# Patient Record
Sex: Female | Born: 1985 | Hispanic: No | Marital: Married | Smoking: Never smoker
Health system: Southern US, Community
[De-identification: ages and names within clinical notes are randomized; demographics above are authoritative.]

---

## 2008-01-25 ENCOUNTER — Other Ambulatory Visit: Admission: RE | Admit: 2008-01-25 | Discharge: 2008-01-25 | Payer: Self-pay | Admitting: Obstetrics and Gynecology

## 2009-02-02 ENCOUNTER — Other Ambulatory Visit: Admission: RE | Admit: 2009-02-02 | Discharge: 2009-02-02 | Payer: Self-pay | Admitting: Obstetrics and Gynecology

## 2009-12-21 ENCOUNTER — Other Ambulatory Visit: Admission: RE | Admit: 2009-12-21 | Discharge: 2009-12-21 | Payer: Self-pay | Admitting: Obstetrics and Gynecology

## 2010-12-17 ENCOUNTER — Other Ambulatory Visit
Admission: RE | Admit: 2010-12-17 | Discharge: 2010-12-17 | Payer: Self-pay | Source: Home / Self Care | Admitting: Obstetrics and Gynecology

## 2010-12-29 ENCOUNTER — Encounter: Payer: Self-pay | Admitting: Obstetrics and Gynecology

## 2012-01-13 ENCOUNTER — Other Ambulatory Visit (HOSPITAL_COMMUNITY)
Admission: RE | Admit: 2012-01-13 | Discharge: 2012-01-13 | Disposition: A | Discharge status: Home / Self Care | Payer: BC Managed Care – PPO | Source: Ambulatory Visit | Attending: Obstetrics and Gynecology | Admitting: Obstetrics and Gynecology

## 2012-01-13 ENCOUNTER — Other Ambulatory Visit: Payer: Self-pay | Admitting: Obstetrics and Gynecology

## 2012-01-13 DIAGNOSIS — Z01419 Encounter for gynecological examination (general) (routine) without abnormal findings: Secondary | ICD-10-CM | POA: Insufficient documentation

## 2013-02-15 ENCOUNTER — Other Ambulatory Visit: Payer: Self-pay | Admitting: Obstetrics and Gynecology

## 2013-02-15 ENCOUNTER — Other Ambulatory Visit (HOSPITAL_COMMUNITY)
Admission: RE | Admit: 2013-02-15 | Discharge: 2013-02-15 | Disposition: A | Discharge status: Home / Self Care | Payer: BC Managed Care – PPO | Source: Ambulatory Visit | Attending: Obstetrics and Gynecology | Admitting: Obstetrics and Gynecology

## 2013-02-15 DIAGNOSIS — Z01419 Encounter for gynecological examination (general) (routine) without abnormal findings: Secondary | ICD-10-CM | POA: Insufficient documentation

## 2014-02-20 ENCOUNTER — Other Ambulatory Visit (HOSPITAL_COMMUNITY)
Admission: RE | Admit: 2014-02-20 | Discharge: 2014-02-20 | Disposition: A | Discharge status: Home / Self Care | Payer: BC Managed Care – PPO | Source: Ambulatory Visit | Attending: Obstetrics and Gynecology | Admitting: Obstetrics and Gynecology

## 2014-02-20 ENCOUNTER — Other Ambulatory Visit: Payer: Self-pay | Admitting: Obstetrics and Gynecology

## 2014-02-20 DIAGNOSIS — Z01419 Encounter for gynecological examination (general) (routine) without abnormal findings: Secondary | ICD-10-CM | POA: Insufficient documentation

## 2014-08-07 ENCOUNTER — Other Ambulatory Visit: Payer: Self-pay | Admitting: Orthopedic Surgery

## 2014-08-07 DIAGNOSIS — M25561 Pain in right knee: Secondary | ICD-10-CM

## 2014-08-16 ENCOUNTER — Ambulatory Visit
Admission: RE | Admit: 2014-08-16 | Discharge: 2014-08-16 | Disposition: A | Discharge status: Home / Self Care | Payer: BC Managed Care – PPO | Source: Ambulatory Visit | Attending: Orthopedic Surgery | Admitting: Orthopedic Surgery

## 2014-08-16 DIAGNOSIS — M25561 Pain in right knee: Secondary | ICD-10-CM

## 2015-03-06 ENCOUNTER — Other Ambulatory Visit (HOSPITAL_COMMUNITY)
Admission: RE | Admit: 2015-03-06 | Discharge: 2015-03-06 | Disposition: A | Discharge status: Home / Self Care | Payer: BLUE CROSS/BLUE SHIELD | Source: Ambulatory Visit | Attending: Obstetrics and Gynecology | Admitting: Obstetrics and Gynecology

## 2015-03-06 ENCOUNTER — Other Ambulatory Visit: Payer: Self-pay | Admitting: Obstetrics and Gynecology

## 2015-03-06 DIAGNOSIS — Z01419 Encounter for gynecological examination (general) (routine) without abnormal findings: Secondary | ICD-10-CM | POA: Insufficient documentation

## 2015-03-08 LAB — CYTOLOGY - PAP

## 2015-09-26 ENCOUNTER — Ambulatory Visit (INDEPENDENT_AMBULATORY_CARE_PROVIDER_SITE_OTHER): Payer: BLUE CROSS/BLUE SHIELD | Admitting: Podiatry

## 2015-09-26 VITALS — BP 108/73 | HR 75 | Resp 16

## 2015-09-26 DIAGNOSIS — L600 Ingrowing nail: Secondary | ICD-10-CM

## 2015-09-26 NOTE — Progress Notes (Signed)
Subjective:     Patient ID: Cynthia Pineda, female   DOB: 10/18/1986, 29 y.o.   MRN: [id number]  HPI patient states this nail continues to come off and split and I have had a remove once 5 years ago but it did not make a big difference   Review of Systems  All other systems reviewed and are negative.      Objective:   Physical Exam  Constitutional: She is oriented to person, place, and time.  Cardiovascular: Intact distal pulses.   Musculoskeletal: Normal range of motion.  Neurological: She is oriented to person, place, and time.  Skin: Skin is warm.  Nursing note and vitals reviewed.  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 I educated patient on this and currently will not hold off and continue the conservative treatments that she is doing

## 2015-09-26 NOTE — Progress Notes (Signed)
   Subjective:    Patient ID: Cynthia Pineda, female    DOB: 10/26/1986, 29 y.o.   MRN: [id number]  HPI  Pt presents with left great nail splitting, h/o previous avulsion to treat similar problem.  Review of Systems  All other systems reviewed and are negative.      Objective:   Physical Exam        Assessment & Plan:

## 2016-03-05 ENCOUNTER — Other Ambulatory Visit (HOSPITAL_COMMUNITY)
Admission: RE | Admit: 2016-03-05 | Discharge: 2016-03-05 | Disposition: A | Discharge status: Home / Self Care | Payer: BLUE CROSS/BLUE SHIELD | Source: Ambulatory Visit | Attending: Obstetrics and Gynecology | Admitting: Obstetrics and Gynecology

## 2016-03-05 ENCOUNTER — Other Ambulatory Visit: Payer: Self-pay | Admitting: Obstetrics and Gynecology

## 2016-03-05 DIAGNOSIS — Z01419 Encounter for gynecological examination (general) (routine) without abnormal findings: Secondary | ICD-10-CM | POA: Diagnosis present

## 2016-03-05 DIAGNOSIS — Z1151 Encounter for screening for human papillomavirus (HPV): Secondary | ICD-10-CM | POA: Diagnosis not present

## 2016-03-07 LAB — CYTOLOGY - PAP

## 2016-10-27 ENCOUNTER — Ambulatory Visit (INDEPENDENT_AMBULATORY_CARE_PROVIDER_SITE_OTHER): Payer: BLUE CROSS/BLUE SHIELD | Admitting: Family Medicine

## 2016-10-27 ENCOUNTER — Ambulatory Visit (INDEPENDENT_AMBULATORY_CARE_PROVIDER_SITE_OTHER): Payer: BLUE CROSS/BLUE SHIELD

## 2016-10-27 VITALS — BP 120/72 | HR 102 | Temp 99.0°F | Resp 18 | Ht 66.0 in | Wt 164.0 lb

## 2016-10-27 DIAGNOSIS — R05 Cough: Secondary | ICD-10-CM | POA: Diagnosis not present

## 2016-10-27 DIAGNOSIS — R059 Cough, unspecified: Secondary | ICD-10-CM

## 2016-10-27 MED ORDER — MOMETASONE FURO-FORMOTEROL FUM 100-5 MCG/ACT IN AERO: 2.0000 | INHALATION_SPRAY | Freq: Two times a day (BID) | RESPIRATORY_TRACT | Status: DC

## 2016-10-27 MED ORDER — AZITHROMYCIN 250 MG PO TABS: ORAL_TABLET | ORAL | 0 refills | Status: AC

## 2016-10-27 MED ORDER — BENZONATATE 100 MG PO CAPS: 100.0000 mg | ORAL_CAPSULE | Freq: Two times a day (BID) | ORAL | 0 refills | Status: AC | PRN

## 2016-10-27 NOTE — Assessment & Plan Note (Signed)
She is having worsening of her symptoms since she was seen at the urgent care. She is also unable to sleep and now having rib pain which is most likely a muscle strain from coughing. Chest x-ray is normal  - start azithromycin  - starts tessalon  - she can continue prednisone  - advised to follow up if worsening or no improvement of her symptoms.

## 2016-10-27 NOTE — Assessment & Plan Note (Deleted)
She has been diagnosed with COPD by chest x-ray. Reports to having ongoing shortness of breath while at work having to talk a lot. She has been using her albuterol on a daily or twice daily basis for some time.  - start dulera  - referral to Pulm for spirometry.

## 2016-10-27 NOTE — Progress Notes (Signed)
   Subjective:    Patient ID: Cynthia Pineda, female    DOB: 01/29/1986, 30 y.o.   MRN: [id number]  Chief Complaint  Patient presents with  . URI    x5 days     PCP: REDMON,NOELLE, PA-C  HPI  This is a 30 y.o. female who is presenting with sickness for about a week. She has had a bad cough for the past three days. She is unable to cough at night. She has a bad pain on her left side. She has trouble talking and nasal congestion. She has been placed on prednisone and mucinex. Her symptoms are worse at night and in the morning. Her cough is occurring while lying down or sitting upright. Has had sick contacts. No fevers or chills or muscle aches. Has not received the flu vaccine. No emesis and one day of diarrhea. Has traveled to [place] at the end of October.   Review of Systems  ROS: No unexpected weight loss, fever, swelling, instability, muscle pain, numbness/tingling, redness, otherwise see HPI   PMH: none  PShx: none  PSx: no tobacco use, occasional alcohol use  FHx: none     Objective:   Physical Exam BP 120/72   Pulse (!) 102   Temp 99 F (37.2 C) (Oral)   Resp 18   Ht 5\' 6"  (1.676 m)   Wt 164 lb (74.4 kg)   LMP 10/07/2016   SpO2 98%   BMI 26.47 kg/m  Gen: NAD, alert, cooperative with exam,  HEENT:  EOMI, clear conjunctiva, supple neck, unable to visualize the left or right TM, cobblestoning of oropharynx, inflamed turbinates, no tonsillar exudates, no cervical LAD  CV: tachycardic, regular rhythm, good S1/S2, no murmur, no edema, capillary refill brisk  Resp: CTABL, no wheezes, non-labored Skin: no rashes, normal turgor  Neuro: no gross deficits.  Psych: alert and oriented      Assessment & Plan:   Cough She is having worsening of her symptoms since she was seen at the urgent care. She is also unable to sleep and now having rib pain which is most likely a muscle strain from coughing. Chest x-ray is normal  - start azithromycin  - starts tessalon  - she can  continue prednisone  - advised to follow up if worsening or no improvement of her symptoms.

## 2016-10-27 NOTE — Patient Instructions (Addendum)
Thank you for coming in,   I have sent in azithromycin and tessalon for your symptoms.   Please return if you symptoms worsen or fail to improve.   Please try honey for your cough as well..     Please feel free to call with any questions or concerns at any time, at [phone]. --Dr. Jordan LikesSchmitz     IF you received an x-ray today, you will receive [facility]. Please contact [facility] at [phone] with questions or concerns regarding your invoice.   IF you received labwork today, you will receive an invoice from [facility]. Please contact [facility] at [phone] with questions or concerns regarding your invoice.   Our billing staff will not be able to assist you with questions regarding bills from these companies.  You will be contacted with the lab results as soon as they are available. The fastest way to get your results is to activate your My Chart account. Instructions are located on the last page of this paperwork. If you have not heard from us regarding the results in 2 weeks, please contact this office.

## 2017-09-14 IMAGING — DX DG CHEST 2V
2 series · 2 of 2 positions shown · non-contrast
Comparison: None.

CLINICAL DATA: Cough, rib pain.  Coughing for 3 days

EXAM:
CHEST  2 VIEW

[chest pa]
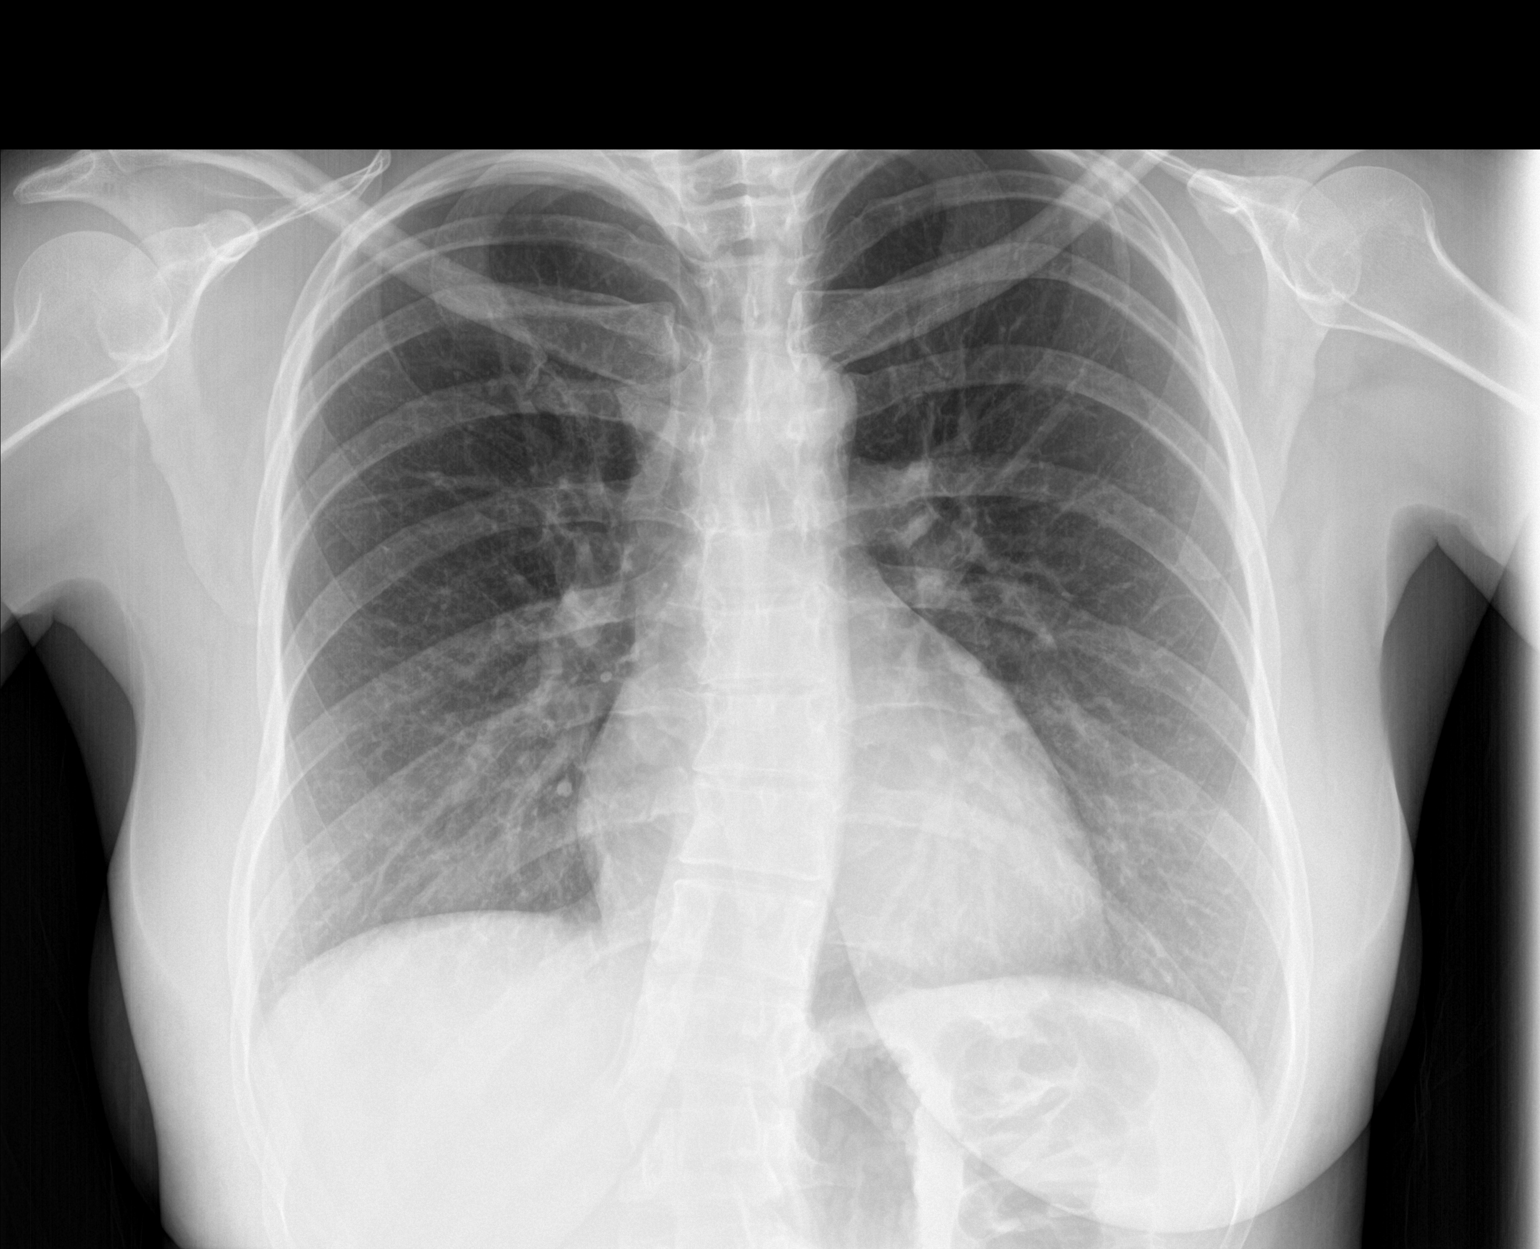

[chest lat]
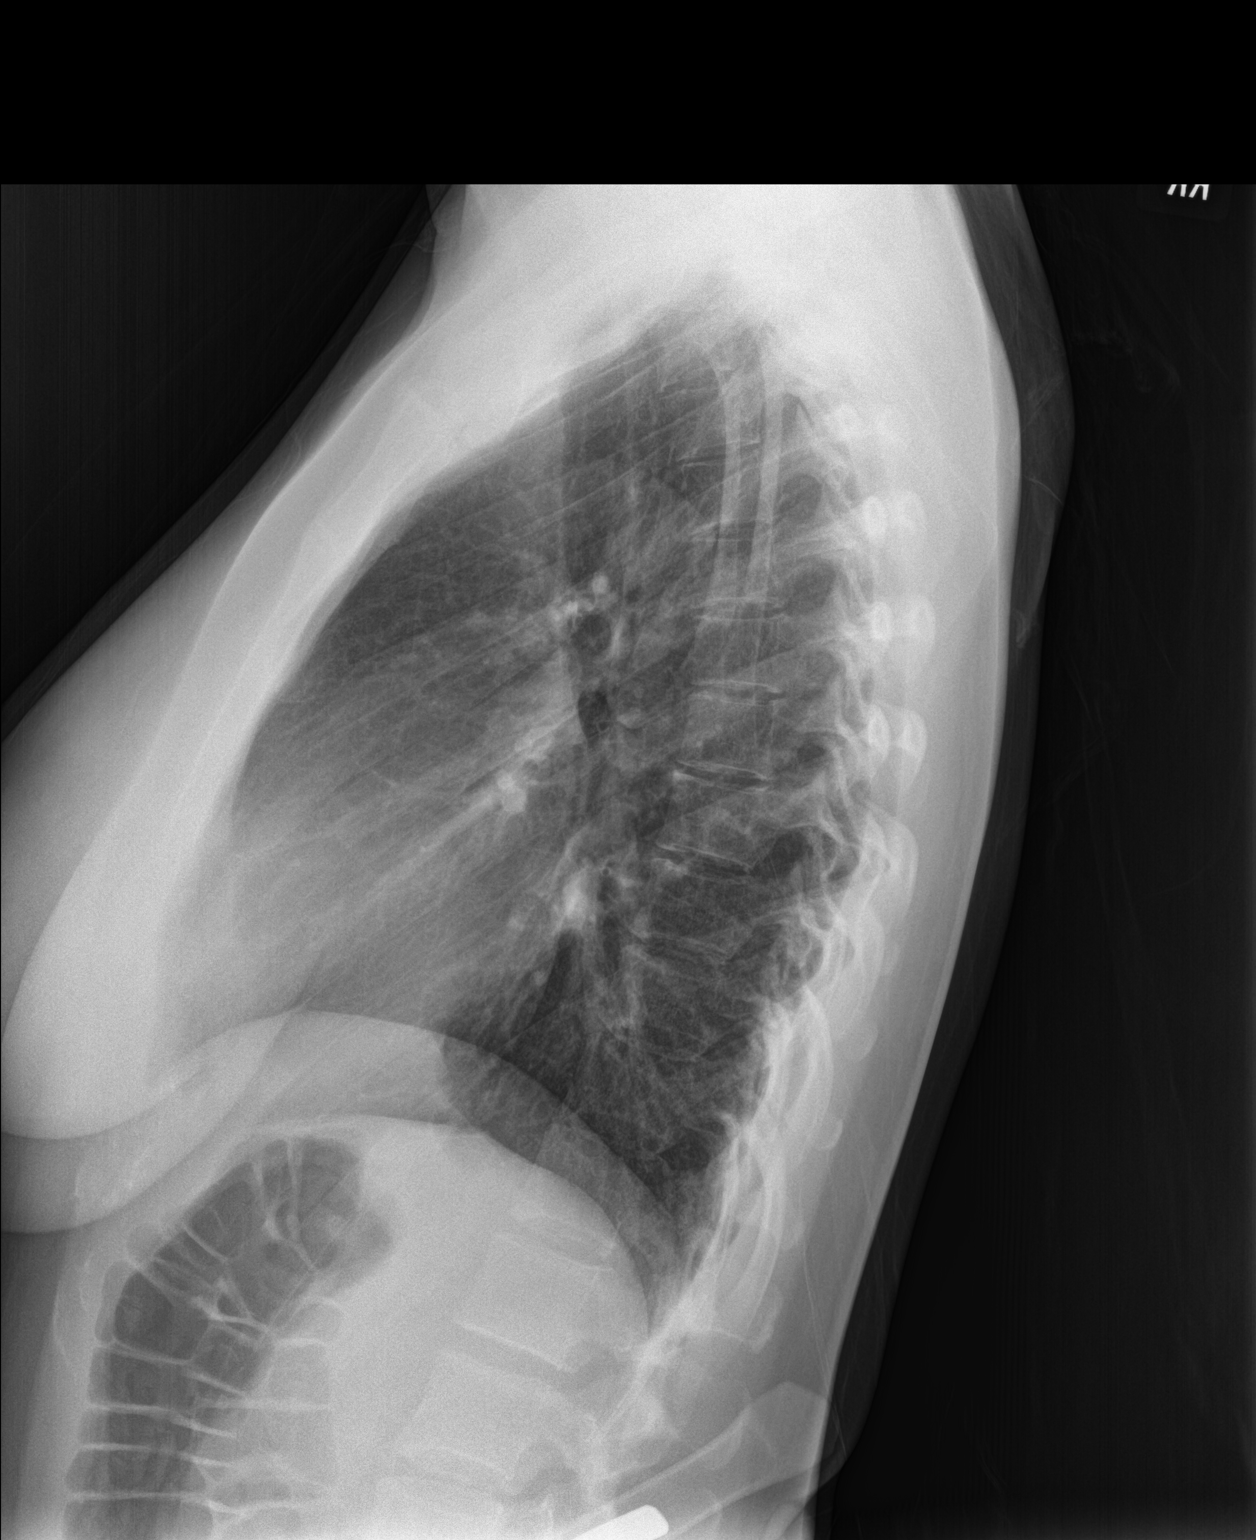

[2 of 2 positions shown; findings below may reference images not displayed]

FINDINGS: Normal mediastinum and cardiac silhouette. Normal pulmonary
vasculature. No evidence of effusion, infiltrate, or pneumothorax.
No acute bony abnormality.
IMPRESSION: Normal chest radiograph.

## 2019-07-12 ENCOUNTER — Other Ambulatory Visit: Payer: Self-pay | Admitting: Obstetrics and Gynecology

## 2019-07-12 ENCOUNTER — Other Ambulatory Visit (HOSPITAL_COMMUNITY)
Admission: RE | Admit: 2019-07-12 | Discharge: 2019-07-12 | Disposition: A | Discharge status: Home / Self Care | Payer: BC Managed Care – PPO | Source: Ambulatory Visit | Attending: Obstetrics and Gynecology | Admitting: Obstetrics and Gynecology

## 2019-07-12 DIAGNOSIS — Z01419 Encounter for gynecological examination (general) (routine) without abnormal findings: Secondary | ICD-10-CM | POA: Diagnosis present

## 2019-07-18 LAB — CYTOLOGY - PAP
Diagnosis: NEGATIVE
HPV 16/18/45 genotyping: NEGATIVE
HPV: DETECTED — AB

## 2019-09-12 ENCOUNTER — Other Ambulatory Visit: Payer: Self-pay

## 2019-09-12 DIAGNOSIS — Z20822 Contact with and (suspected) exposure to covid-19: Secondary | ICD-10-CM

## 2019-09-14 LAB — NOVEL CORONAVIRUS, NAA: SARS-CoV-2, NAA: NOT DETECTED

## 2019-11-07 ENCOUNTER — Other Ambulatory Visit: Payer: Self-pay

## 2019-11-07 DIAGNOSIS — Z20822 Contact with and (suspected) exposure to covid-19: Secondary | ICD-10-CM

## 2019-11-08 LAB — NOVEL CORONAVIRUS, NAA: SARS-CoV-2, NAA: NOT DETECTED

## 2020-01-13 ENCOUNTER — Ambulatory Visit: Payer: BC Managed Care – PPO | Attending: Internal Medicine

## 2020-01-13 DIAGNOSIS — Z20822 Contact with and (suspected) exposure to covid-19: Secondary | ICD-10-CM

## 2020-01-15 LAB — NOVEL CORONAVIRUS, NAA: SARS-CoV-2, NAA: NOT DETECTED

## 2020-02-12 ENCOUNTER — Ambulatory Visit: Payer: BC Managed Care – PPO

## 2022-10-22 ENCOUNTER — Ambulatory Visit
Admission: RE | Admit: 2022-10-22 | Discharge: 2022-10-22 | Disposition: A | Discharge status: Home / Self Care | Payer: Managed Care, Other (non HMO) | Source: Ambulatory Visit | Attending: Physician Assistant | Admitting: Physician Assistant

## 2022-10-22 ENCOUNTER — Other Ambulatory Visit: Payer: Self-pay | Admitting: Physician Assistant

## 2022-10-22 DIAGNOSIS — R059 Cough, unspecified: Secondary | ICD-10-CM

## 2023-11-11 ENCOUNTER — Other Ambulatory Visit (HOSPITAL_COMMUNITY)
Admission: RE | Admit: 2023-11-11 | Discharge: 2023-11-11 | Disposition: A | Discharge status: Home / Self Care | Payer: Managed Care, Other (non HMO) | Source: Ambulatory Visit | Attending: Obstetrics and Gynecology | Admitting: Obstetrics and Gynecology

## 2023-11-11 ENCOUNTER — Other Ambulatory Visit: Payer: Self-pay | Admitting: Obstetrics and Gynecology

## 2023-11-11 DIAGNOSIS — Z01419 Encounter for gynecological examination (general) (routine) without abnormal findings: Secondary | ICD-10-CM | POA: Diagnosis present

## 2023-11-13 LAB — CYTOLOGY - PAP
Comment: NEGATIVE
Diagnosis: NEGATIVE
High risk HPV: NEGATIVE
